# Patient Record
Sex: Male | Born: 2000 | Race: White | Hispanic: No | Marital: Single | Smoking: Never smoker
Health system: Southern US, Community
[De-identification: ages and names within clinical notes are randomized; demographics above are authoritative.]

## PROBLEM LIST (undated history)

## (undated) DIAGNOSIS — F938 Other childhood emotional disorders: Secondary | ICD-10-CM

## (undated) DIAGNOSIS — F909 Attention-deficit hyperactivity disorder, unspecified type: Secondary | ICD-10-CM

## (undated) DIAGNOSIS — R634 Abnormal weight loss: Secondary | ICD-10-CM

## (undated) DIAGNOSIS — G4700 Insomnia, unspecified: Secondary | ICD-10-CM

## (undated) DIAGNOSIS — R51 Headache: Secondary | ICD-10-CM

## (undated) DIAGNOSIS — Z789 Other specified health status: Secondary | ICD-10-CM

## (undated) DIAGNOSIS — R519 Headache, unspecified: Secondary | ICD-10-CM

## (undated) HISTORY — DX: Abnormal weight loss: R63.4

---
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
## 2020-09-07 ENCOUNTER — Encounter (INDEPENDENT_AMBULATORY_CARE_PROVIDER_SITE_OTHER): Payer: Self-pay | Admitting: *Deleted

## 2021-01-11 ENCOUNTER — Ambulatory Visit (INDEPENDENT_AMBULATORY_CARE_PROVIDER_SITE_OTHER): Payer: Self-pay | Admitting: Gastroenterology

## 2022-04-30 DIAGNOSIS — R0981 Nasal congestion: Secondary | ICD-10-CM | POA: Diagnosis not present

## 2022-04-30 DIAGNOSIS — R030 Elevated blood-pressure reading, without diagnosis of hypertension: Secondary | ICD-10-CM | POA: Diagnosis not present

## 2022-04-30 DIAGNOSIS — H6592 Unspecified nonsuppurative otitis media, left ear: Secondary | ICD-10-CM | POA: Diagnosis not present

## 2022-04-30 DIAGNOSIS — H9202 Otalgia, left ear: Secondary | ICD-10-CM | POA: Diagnosis not present

## 2022-05-02 DIAGNOSIS — H9202 Otalgia, left ear: Secondary | ICD-10-CM | POA: Diagnosis not present

## 2022-05-02 DIAGNOSIS — R519 Headache, unspecified: Secondary | ICD-10-CM | POA: Diagnosis not present

## 2022-05-02 DIAGNOSIS — Z1152 Encounter for screening for COVID-19: Secondary | ICD-10-CM | POA: Diagnosis not present

## 2022-05-02 DIAGNOSIS — B349 Viral infection, unspecified: Secondary | ICD-10-CM | POA: Diagnosis not present

## 2022-05-02 DIAGNOSIS — Z880 Allergy status to penicillin: Secondary | ICD-10-CM | POA: Diagnosis not present

## 2022-08-05 DIAGNOSIS — R030 Elevated blood-pressure reading, without diagnosis of hypertension: Secondary | ICD-10-CM | POA: Diagnosis not present

## 2022-08-05 DIAGNOSIS — F17200 Nicotine dependence, unspecified, uncomplicated: Secondary | ICD-10-CM | POA: Diagnosis not present

## 2022-08-05 DIAGNOSIS — Z6823 Body mass index (BMI) 23.0-23.9, adult: Secondary | ICD-10-CM | POA: Diagnosis not present

## 2022-09-23 DIAGNOSIS — F17200 Nicotine dependence, unspecified, uncomplicated: Secondary | ICD-10-CM | POA: Diagnosis not present

## 2022-09-23 DIAGNOSIS — R030 Elevated blood-pressure reading, without diagnosis of hypertension: Secondary | ICD-10-CM | POA: Diagnosis not present

## 2022-09-23 DIAGNOSIS — F17210 Nicotine dependence, cigarettes, uncomplicated: Secondary | ICD-10-CM | POA: Diagnosis not present

## 2022-09-23 DIAGNOSIS — Z6823 Body mass index (BMI) 23.0-23.9, adult: Secondary | ICD-10-CM | POA: Diagnosis not present

## 2022-10-01 DIAGNOSIS — R030 Elevated blood-pressure reading, without diagnosis of hypertension: Secondary | ICD-10-CM | POA: Diagnosis not present

## 2022-10-01 DIAGNOSIS — Z6823 Body mass index (BMI) 23.0-23.9, adult: Secondary | ICD-10-CM | POA: Diagnosis not present

## 2022-10-01 DIAGNOSIS — J069 Acute upper respiratory infection, unspecified: Secondary | ICD-10-CM | POA: Diagnosis not present

## 2022-10-01 DIAGNOSIS — J029 Acute pharyngitis, unspecified: Secondary | ICD-10-CM | POA: Diagnosis not present
